# Patient Record
Sex: Male | Born: 1965 | Race: White | Hispanic: No | Marital: Married | Smoking: Current every day smoker
Health system: Southern US, Community
[De-identification: ages and names within clinical notes are randomized; demographics above are authoritative.]

## PROBLEM LIST (undated history)

## (undated) DIAGNOSIS — M549 Dorsalgia, unspecified: Secondary | ICD-10-CM

## (undated) DIAGNOSIS — S66929A Laceration of unspecified muscle, fascia and tendon at wrist and hand level, unspecified hand, initial encounter: Secondary | ICD-10-CM

## (undated) DIAGNOSIS — S7290XA Unspecified fracture of unspecified femur, initial encounter for closed fracture: Secondary | ICD-10-CM

## (undated) DIAGNOSIS — I10 Essential (primary) hypertension: Secondary | ICD-10-CM

## (undated) DIAGNOSIS — S61419A Laceration without foreign body of unspecified hand, initial encounter: Secondary | ICD-10-CM

## (undated) DIAGNOSIS — Z8739 Personal history of other diseases of the musculoskeletal system and connective tissue: Secondary | ICD-10-CM

## (undated) DIAGNOSIS — S92902A Unspecified fracture of left foot, initial encounter for closed fracture: Secondary | ICD-10-CM

## (undated) HISTORY — DX: Dorsalgia, unspecified: M54.9

## (undated) HISTORY — DX: Unspecified fracture of left foot, initial encounter for closed fracture: S92.902A

## (undated) HISTORY — DX: Laceration without foreign body of unspecified hand, initial encounter: S61.419A

## (undated) HISTORY — DX: Essential (primary) hypertension: I10

## (undated) HISTORY — DX: Unspecified fracture of unspecified femur, initial encounter for closed fracture: S72.90XA

## (undated) HISTORY — DX: Personal history of other diseases of the musculoskeletal system and connective tissue: Z87.39

## (undated) HISTORY — DX: Laceration of unspecified muscle, fascia and tendon at wrist and hand level, unspecified hand, initial encounter: S66.929A

---

## 1984-07-06 DIAGNOSIS — S7290XA Unspecified fracture of unspecified femur, initial encounter for closed fracture: Secondary | ICD-10-CM

## 1984-07-06 DIAGNOSIS — S92902A Unspecified fracture of left foot, initial encounter for closed fracture: Secondary | ICD-10-CM

## 1984-07-06 DIAGNOSIS — S61419A Laceration without foreign body of unspecified hand, initial encounter: Secondary | ICD-10-CM

## 1984-07-06 HISTORY — DX: Laceration without foreign body of unspecified hand, initial encounter: S61.419A

## 1984-07-06 HISTORY — DX: Unspecified fracture of unspecified femur, initial encounter for closed fracture: S72.90XA

## 1984-07-06 HISTORY — DX: Unspecified fracture of left foot, initial encounter for closed fracture: S92.902A

## 2015-05-14 DIAGNOSIS — I10 Essential (primary) hypertension: Secondary | ICD-10-CM | POA: Insufficient documentation

## 2015-05-14 DIAGNOSIS — Z87898 Personal history of other specified conditions: Secondary | ICD-10-CM | POA: Insufficient documentation

## 2015-05-14 DIAGNOSIS — M549 Dorsalgia, unspecified: Secondary | ICD-10-CM | POA: Insufficient documentation

## 2015-05-14 DIAGNOSIS — M542 Cervicalgia: Secondary | ICD-10-CM | POA: Insufficient documentation

## 2015-06-04 DIAGNOSIS — D45 Polycythemia vera: Secondary | ICD-10-CM | POA: Insufficient documentation

## 2015-06-04 DIAGNOSIS — E781 Pure hyperglyceridemia: Secondary | ICD-10-CM | POA: Insufficient documentation

## 2015-06-14 ENCOUNTER — Encounter: Payer: Self-pay | Admitting: Oncology

## 2015-06-14 ENCOUNTER — Inpatient Hospital Stay: Payer: BLUE CROSS/BLUE SHIELD | Attending: Oncology | Admitting: Oncology

## 2015-06-14 ENCOUNTER — Inpatient Hospital Stay: Payer: BLUE CROSS/BLUE SHIELD

## 2015-06-14 VITALS — BP 149/104 | HR 72 | Temp 97.5°F | Resp 18 | Wt 183.4 lb

## 2015-06-14 DIAGNOSIS — I10 Essential (primary) hypertension: Secondary | ICD-10-CM | POA: Diagnosis not present

## 2015-06-14 DIAGNOSIS — D751 Secondary polycythemia: Secondary | ICD-10-CM

## 2015-06-14 DIAGNOSIS — F17210 Nicotine dependence, cigarettes, uncomplicated: Secondary | ICD-10-CM

## 2015-06-14 DIAGNOSIS — D45 Polycythemia vera: Secondary | ICD-10-CM

## 2015-06-14 DIAGNOSIS — Z79899 Other long term (current) drug therapy: Secondary | ICD-10-CM

## 2015-06-14 LAB — IRON AND TIBC
IRON: 74 ug/dL (ref 45–182)
SATURATION RATIOS: 20 % (ref 17.9–39.5)
TIBC: 367 ug/dL (ref 250–450)
UIBC: 293 ug/dL

## 2015-06-14 LAB — CBC
HEMATOCRIT: 55 % — AB (ref 40.0–52.0)
HEMOGLOBIN: 19.2 g/dL — AB (ref 13.0–18.0)
MCH: 33.1 pg (ref 26.0–34.0)
MCHC: 34.9 g/dL (ref 32.0–36.0)
MCV: 94.9 fL (ref 80.0–100.0)
Platelets: 265 10*3/uL (ref 150–440)
RBC: 5.8 MIL/uL (ref 4.40–5.90)
RDW: 12.8 % (ref 11.5–14.5)
WBC: 11.9 10*3/uL — ABNORMAL HIGH (ref 3.8–10.6)

## 2015-06-14 LAB — FERRITIN: Ferritin: 132 ng/mL (ref 24–336)

## 2015-06-14 NOTE — Progress Notes (Signed)
Patient offers no problems today.  Blood pressure is elevated

## 2015-06-19 LAB — JAK2 GENOTYPR

## 2015-06-19 LAB — CARBON MONOXIDE, BLOOD (PERFORMED AT REF LAB): CARBON MONOXIDE, BLOOD: 6.9 % — AB (ref 0.0–1.9)

## 2015-06-19 LAB — ERYTHROPOIETIN: Erythropoietin: 9.3 m[IU]/mL (ref 2.6–18.5)

## 2015-06-20 LAB — HEMOCHROMATOSIS DNA-PCR(C282Y,H63D)

## 2015-06-21 ENCOUNTER — Inpatient Hospital Stay: Payer: BLUE CROSS/BLUE SHIELD

## 2015-06-21 ENCOUNTER — Inpatient Hospital Stay (HOSPITAL_BASED_OUTPATIENT_CLINIC_OR_DEPARTMENT_OTHER): Payer: BLUE CROSS/BLUE SHIELD | Admitting: Oncology

## 2015-06-21 VITALS — BP 156/95 | HR 89 | Temp 98.1°F | Resp 16

## 2015-06-21 DIAGNOSIS — Z79899 Other long term (current) drug therapy: Secondary | ICD-10-CM | POA: Diagnosis not present

## 2015-06-21 DIAGNOSIS — F17210 Nicotine dependence, cigarettes, uncomplicated: Secondary | ICD-10-CM

## 2015-06-21 DIAGNOSIS — D751 Secondary polycythemia: Secondary | ICD-10-CM | POA: Diagnosis not present

## 2015-06-21 DIAGNOSIS — I10 Essential (primary) hypertension: Secondary | ICD-10-CM

## 2015-06-21 NOTE — Progress Notes (Signed)
[facility]  [phone] [phone]) [phone]  ID: Joel Levine OB: 08/07/1965  MR#: [id number]  [id number]  No care team member to display  CHIEF COMPLAINT:  Chief Complaint  Patient presents with  . New Evaluation    polycythemia    INTERVAL HISTORY: Patient is a 49 year old male who was found to have a significantly elevated hemoglobin on routine blood work. Repeat testing confirmed the results. He currently feels well and is asymptomatic. He denies any recent fevers or illnesses. He has no new medications. He has a good appetite and denies weight loss. He has no neurologic complaints. He denies any chest pain or shortness of breath. He denies any nausea, vomiting, constipation, or diarrhea. He has no urinary complaints. Patient offers no specific complaints today.  REVIEW OF SYSTEMS:   Review of Systems  Constitutional: Negative.  Negative for fever, weight loss and malaise/fatigue.  Respiratory: Negative.   Cardiovascular: Negative.   Gastrointestinal: Negative.   Musculoskeletal: Negative.   Neurological: Negative.  Negative for weakness.    As per HPI. Otherwise, a complete review of systems is negatve.  PAST MEDICAL HISTORY: Past Medical History  Diagnosis Date  . History of low back pain     Uses leverage and gravity from injury 30 years ago  . Upper back pain   . Essential hypertension   . Fracture, femur ([place]) 1986    shattered, left  . Hand laceration involving tendon 1986    left  . Foot fracture, left 1986    PAST SURGICAL HISTORY: No past surgical history on file.  FAMILY HISTORY Family History  Problem Relation Age of Onset  . Alzheimer's disease Mother   . Cancer Father     unknown origin       ADVANCED DIRECTIVES:    HEALTH MAINTENANCE: Social History  Substance Use Topics  . Smoking status: Current Every Day Smoker -- 1.00 packs/day    Types: Cigarettes  . Smokeless tobacco: Never Used  . Alcohol Use:  0.0 oz/week    0 Standard drinks or equivalent per week     Comment: 15 cans of beer     Colonoscopy:  PAP:  Bone density:  Lipid panel:  No Known Allergies  Current Outpatient Prescriptions  Medication Sig Dispense Refill  . hydrochlorothiazide (HYDRODIURIL) 25 MG tablet TAKE 1 TABLET (25 MG TOTAL) BY MOUTH ONCE DAILY.  11   No current facility-administered medications for this visit.    OBJECTIVE: Filed Vitals:   06/14/15 1144  BP: 149/104  Pulse: 72  Temp: 97.5 F (36.4 C)  Resp: 18     There is no height on file to calculate BMI.    ECOG FS:0 - Asymptomatic  General: Well-developed, well-nourished, no acute distress. Eyes: Pink conjunctiva, anicteric sclera. HEENT: Normocephalic, moist mucous membranes, clear oropharnyx. Lungs: Clear to auscultation bilaterally. Heart: Regular rate and rhythm. No rubs, murmurs, or gallops. Abdomen: Soft, nontender, nondistended. No organomegaly noted, normoactive bowel sounds. Musculoskeletal: No edema, cyanosis, or clubbing. Neuro: Alert, answering all questions appropriately. Cranial nerves grossly intact. Skin: No rashes or petechiae noted. Psych: Normal affect. Lymphatics: No cervical, calvicular, axillary or inguinal LAD.   LAB RESULTS:  No results found for: NA, K, CL, CO2, GLUCOSE, BUN, CREATININE, CALCIUM, PROT, ALBUMIN, AST, ALT, ALKPHOS, BILITOT, GFRNONAA, GFRAA  Lab Results  Component Value Date   WBC 11.9* 06/14/2015   HGB 19.2* 06/14/2015   HCT 55.0* 06/14/2015   MCV 94.9 06/14/2015   PLT 265 06/14/2015  STUDIES: No results found.  ASSESSMENT: Secondary polycythemia  PLAN:    1. Polycythemia: Patient's hemoglobin is significantly elevated. He was found to have a carboxyhemoglobin of approximately 7% which is likely contributing to his polycythemia. The remainder of his blood work including JAK-2 mutation and iron stores are within normal limits. Patient will require phlebotomy has agreed to return to  clinic in 1 week to discuss his laboratory results and initiation of treatments. 2. Hypertension: Blood pressure mildly elevated today, continue current medications as prescribed.  Patient expressed understanding and was in agreement with this plan. He also understands that He can call clinic at any time with any questions, concerns, or complaints.    Joel Huger, MD   06/21/2015 12:16 PM

## 2015-06-21 NOTE — Progress Notes (Signed)
[facility]  Telephone:(336) [phone] [phone]) [phone]  ID: Joel Levine OB: October 17, 1965  MR#: [id number]  [id number]  No care team member to display  CHIEF COMPLAINT:  Chief Complaint  Patient presents with  . polycythemia    INTERVAL HISTORY: Patient returns to clinic today for further evaluation, discussion of his laboratory work, and initiation of phlebotomy. He is mildly anxious, but otherwise feels well.  He denies any recent fevers or illnesses. He has no new medications. He has a good appetite and denies weight loss. He has no neurologic complaints. He denies any chest pain or shortness of breath. He denies any nausea, vomiting, constipation, or diarrhea. He has no urinary complaints. Patient offers no specific complaints today.  REVIEW OF SYSTEMS:   Review of Systems  Constitutional: Negative.  Negative for fever, weight loss and malaise/fatigue.  Respiratory: Negative.   Cardiovascular: Negative.   Gastrointestinal: Negative.   Musculoskeletal: Negative.   Neurological: Negative.  Negative for weakness.  Psychiatric/Behavioral: The patient is nervous/anxious.     As per HPI. Otherwise, a complete review of systems is negatve.  PAST MEDICAL HISTORY: Past Medical History  Diagnosis Date  . History of low back pain     Uses leverage and gravity from injury 30 years ago  . Upper back pain   . Essential hypertension   . Fracture, femur ([place]) 1986    shattered, left  . Hand laceration involving tendon 1986    left  . Foot fracture, left 1986    PAST SURGICAL HISTORY: No past surgical history on file.  FAMILY HISTORY Family History  Problem Relation Age of Onset  . Alzheimer's disease Mother   . Cancer Father     unknown origin       ADVANCED DIRECTIVES:    HEALTH MAINTENANCE: Social History  Substance Use Topics  . Smoking status: Current Every Day Smoker -- 1.00 packs/day    Types: Cigarettes  . Smokeless tobacco: Never Used    . Alcohol Use: 0.0 oz/week    0 Standard drinks or equivalent per week     Comment: 15 cans of beer     Colonoscopy:  PAP:  Bone density:  Lipid panel:  No Known Allergies  Current Outpatient Prescriptions  Medication Sig Dispense Refill  . hydrochlorothiazide (HYDRODIURIL) 25 MG tablet TAKE 1 TABLET (25 MG TOTAL) BY MOUTH ONCE DAILY.  11   No current facility-administered medications for this visit.    OBJECTIVE: Filed Vitals:   06/21/15 1009  BP: 156/95  Pulse: 89  Temp: 98.1 F (36.7 C)  Resp: 16     There is no height or weight on file to calculate BMI.    ECOG FS:0 - Asymptomatic  General: Well-developed, well-nourished, no acute distress. Eyes: Pink conjunctiva, anicteric sclera. Lungs: Clear to auscultation bilaterally. Heart: Regular rate and rhythm. No rubs, murmurs, or gallops. Abdomen: Soft, nontender, nondistended. No organomegaly noted, normoactive bowel sounds. Musculoskeletal: No edema, cyanosis, or clubbing. Neuro: Alert, answering all questions appropriately. Cranial nerves grossly intact. Skin: No rashes or petechiae noted. Psych: Normal affect.   LAB RESULTS:  No results found for: NA, K, CL, CO2, GLUCOSE, BUN, CREATININE, CALCIUM, PROT, ALBUMIN, AST, ALT, ALKPHOS, BILITOT, GFRNONAA, GFRAA  Lab Results  Component Value Date   WBC 11.9* 06/14/2015   HGB 19.2* 06/14/2015   HCT 55.0* 06/14/2015   MCV 94.9 06/14/2015   PLT 265 06/14/2015     STUDIES: No results found.  ASSESSMENT: Secondary polycythemia  PLAN:  1. Polycythemia: Patient's hemoglobin is significantly elevated 19.2. He was found to have a carboxyhemoglobin of approximately 7% which is likely contributing to his polycythemia. The remainder of his blood work including JAK-2 mutation and iron stores are within normal limits. Patient has to leave for work, therefore he will return next week to receive 400 mL phlebotomy. He will then return to clinic in 3 months with repeat  laboratory work and further evaluation. 2. Hypertension: Blood pressure mildly elevated today, continue current medications as prescribed. 3. Smoking cessation: Patient expressed understanding that the reason for his polycythemia is likely secondary to his ongoing tobacco use. He does not expressed a desire to quit at this time.  Patient expressed understanding and was in agreement with this plan. He also understands that He can call clinic at any time with any questions, concerns, or complaints.    [name], MD   06/21/2015 12:19 PM

## 2015-07-04 ENCOUNTER — Inpatient Hospital Stay: Payer: BLUE CROSS/BLUE SHIELD

## 2015-07-04 VITALS — BP 152/97 | HR 85 | Temp 98.1°F

## 2015-07-04 DIAGNOSIS — D751 Secondary polycythemia: Secondary | ICD-10-CM

## 2015-07-04 NOTE — Progress Notes (Signed)
400 units of blood removed from patient.  Patient denies pain, discomfort or dizziness, tolerated well.

## 2015-07-04 NOTE — Progress Notes (Signed)
Per Santiago Glad, Rn spoke w/ Dr. Grayland Ormond.  No new orders for labs at this time.

## 2015-08-23 ENCOUNTER — Other Ambulatory Visit: Payer: BLUE CROSS/BLUE SHIELD

## 2015-08-23 ENCOUNTER — Ambulatory Visit: Payer: BLUE CROSS/BLUE SHIELD | Admitting: Oncology

## 2015-09-13 ENCOUNTER — Other Ambulatory Visit: Payer: Self-pay | Admitting: *Deleted

## 2015-09-13 DIAGNOSIS — D45 Polycythemia vera: Secondary | ICD-10-CM

## 2015-09-19 ENCOUNTER — Other Ambulatory Visit: Payer: Self-pay | Admitting: *Deleted

## 2015-09-19 DIAGNOSIS — D45 Polycythemia vera: Secondary | ICD-10-CM

## 2015-09-20 ENCOUNTER — Other Ambulatory Visit: Payer: BLUE CROSS/BLUE SHIELD

## 2015-09-20 ENCOUNTER — Ambulatory Visit: Payer: BLUE CROSS/BLUE SHIELD | Admitting: Oncology

## 2015-10-04 ENCOUNTER — Inpatient Hospital Stay: Payer: BLUE CROSS/BLUE SHIELD

## 2015-10-04 ENCOUNTER — Inpatient Hospital Stay: Payer: BLUE CROSS/BLUE SHIELD | Admitting: Oncology

## 2015-10-11 ENCOUNTER — Inpatient Hospital Stay (HOSPITAL_BASED_OUTPATIENT_CLINIC_OR_DEPARTMENT_OTHER): Payer: BLUE CROSS/BLUE SHIELD | Admitting: Oncology

## 2015-10-11 ENCOUNTER — Inpatient Hospital Stay: Payer: BLUE CROSS/BLUE SHIELD | Attending: Oncology

## 2015-10-11 ENCOUNTER — Inpatient Hospital Stay: Payer: BLUE CROSS/BLUE SHIELD

## 2015-10-11 VITALS — BP 135/89 | HR 69 | Temp 98.6°F | Resp 16 | Wt 182.1 lb

## 2015-10-11 DIAGNOSIS — D751 Secondary polycythemia: Secondary | ICD-10-CM

## 2015-10-11 DIAGNOSIS — F17210 Nicotine dependence, cigarettes, uncomplicated: Secondary | ICD-10-CM | POA: Insufficient documentation

## 2015-10-11 DIAGNOSIS — I10 Essential (primary) hypertension: Secondary | ICD-10-CM | POA: Insufficient documentation

## 2015-10-11 DIAGNOSIS — Z79899 Other long term (current) drug therapy: Secondary | ICD-10-CM | POA: Diagnosis not present

## 2015-10-11 DIAGNOSIS — Z7982 Long term (current) use of aspirin: Secondary | ICD-10-CM | POA: Insufficient documentation

## 2015-10-11 DIAGNOSIS — D45 Polycythemia vera: Secondary | ICD-10-CM

## 2015-10-11 LAB — CBC WITH DIFFERENTIAL/PLATELET
BASOS ABS: 0.1 10*3/uL (ref 0–0.1)
BASOS PCT: 1 %
EOS ABS: 0.7 10*3/uL (ref 0–0.7)
Eosinophils Relative: 8 %
HCT: 45.9 % (ref 40.0–52.0)
HEMOGLOBIN: 15.9 g/dL (ref 13.0–18.0)
LYMPHS ABS: 2.3 10*3/uL (ref 1.0–3.6)
Lymphocytes Relative: 24 %
MCH: 32.7 pg (ref 26.0–34.0)
MCHC: 34.6 g/dL (ref 32.0–36.0)
MCV: 94.5 fL (ref 80.0–100.0)
Monocytes Absolute: 0.8 10*3/uL (ref 0.2–1.0)
Monocytes Relative: 8 %
NEUTROS PCT: 59 %
Neutro Abs: 5.6 10*3/uL (ref 1.4–6.5)
PLATELETS: 376 10*3/uL (ref 150–440)
RBC: 4.86 MIL/uL (ref 4.40–5.90)
RDW: 12.7 % (ref 11.5–14.5)
WBC: 9.5 10*3/uL (ref 3.8–10.6)

## 2015-10-11 NOTE — Progress Notes (Signed)
Patient does not offer any problems today.  

## 2015-10-20 NOTE — Progress Notes (Signed)
[facility]  Telephone:(336) [phone] [phone]) [phone]  ID: Fredia Sorrow OB: 1966-03-22  MR#: [id number]  [id number]  No care team member to display  CHIEF COMPLAINT:  Chief Complaint  Patient presents with  . polycythemia    INTERVAL HISTORY: Patient returns to clinic today for further evaluation and consideration of additional phlebotomy. He currently feels well and is asymptomatic.  He denies any recent fevers or illnesses. He has no new medications. He has a good appetite and denies weight loss. He has no neurologic complaints. He denies any chest pain or shortness of breath. He denies any nausea, vomiting, constipation, or diarrhea. He has no urinary complaints. Patient offers no specific complaints today.  REVIEW OF SYSTEMS:   Review of Systems  Constitutional: Negative.  Negative for fever, weight loss and malaise/fatigue.  Respiratory: Negative.  Negative for shortness of breath.   Cardiovascular: Negative.  Negative for chest pain.  Gastrointestinal: Negative.   Genitourinary: Negative.   Musculoskeletal: Negative.   Neurological: Negative.  Negative for weakness.  Psychiatric/Behavioral: The patient is not nervous/anxious.     As per HPI. Otherwise, a complete review of systems is negatve.  PAST MEDICAL HISTORY: Past Medical History  Diagnosis Date  . History of low back pain     Uses leverage and gravity from injury 30 years ago  . Upper back pain   . Essential hypertension   . Fracture, femur ([place]) 1986    shattered, left  . Hand laceration involving tendon 1986    left  . Foot fracture, left 1986    PAST SURGICAL HISTORY: No past surgical history on file.  FAMILY HISTORY Family History  Problem Relation Age of Onset  . Alzheimer's disease Mother   . Cancer Father     unknown origin       ADVANCED DIRECTIVES:    HEALTH MAINTENANCE: Social History  Substance Use Topics  . Smoking status: Current Every Day Smoker -- 1.00  packs/day    Types: Cigarettes  . Smokeless tobacco: Never Used  . Alcohol Use: 0.0 oz/week    0 Standard drinks or equivalent per week     Comment: 15 cans of beer     Colonoscopy:  PAP:  Bone density:  Lipid panel:  No Known Allergies  Current Outpatient Prescriptions  Medication Sig Dispense Refill  . aspirin 81 MG tablet Take 81 mg by mouth daily.    . hydrochlorothiazide (HYDRODIURIL) 25 MG tablet TAKE 1 TABLET (25 MG TOTAL) BY MOUTH ONCE DAILY.  11  . HYDROcodone-acetaminophen (NORCO/VICODIN) 5-325 MG tablet     . lisinopril (PRINIVIL,ZESTRIL) 10 MG tablet Take by mouth.    . naproxen (NAPROSYN) 500 MG tablet Take by mouth.     No current facility-administered medications for this visit.    OBJECTIVE: Filed Vitals:   10/11/15 1100  BP: 135/89  Pulse: 69  Temp: 98.6 F (37 C)  Resp: 16     There is no height on file to calculate BMI.    ECOG FS:0 - Asymptomatic  General: Well-developed, well-nourished, no acute distress. Eyes: Pink conjunctiva, anicteric sclera. Lungs: Clear to auscultation bilaterally. Heart: Regular rate and rhythm. No rubs, murmurs, or gallops. Abdomen: Soft, nontender, nondistended. No organomegaly noted, normoactive bowel sounds. Musculoskeletal: No edema, cyanosis, or clubbing. Neuro: Alert, answering all questions appropriately. Cranial nerves grossly intact. Skin: No rashes or petechiae noted. Psych: Normal affect.   LAB RESULTS:  No results found for: NA, K, CL, CO2, GLUCOSE, BUN, CREATININE, CALCIUM,  PROT, ALBUMIN, AST, ALT, ALKPHOS, BILITOT, GFRNONAA, GFRAA  Lab Results  Component Value Date   WBC 9.5 10/11/2015   NEUTROABS 5.6 10/11/2015   HGB 15.9 10/11/2015   HCT 45.9 10/11/2015   MCV 94.5 10/11/2015   PLT 376 10/11/2015   Lab Results  Component Value Date   FERRITIN 132 06/14/2015     STUDIES: No results found.  ASSESSMENT: Secondary polycythemia  PLAN:    1. Polycythemia: Patient's hemoglobin has improved  significantly and is now 15.9. He does not require additional 400 mL phlebotomy today. Previously, he was found to have a carboxyhemoglobin of approximately 7% which is likely contributing to his polycythemia. The remainder of his blood work including JAK-2 mutation and iron stores are within normal limits. Return to clinic in 4 months with repeat laboratory work and further evaluation. 2. Hypertension: Blood pressure mildly elevated today, continue current medications as prescribed. 3. Smoking cessation: Patient expressed understanding that the reason for his polycythemia is likely secondary to his ongoing tobacco use. He does not expressed a desire to quit at this time.  Patient expressed understanding and was in agreement with this plan. He also understands that He can call clinic at any time with any questions, concerns, or complaints.    [name], MD   10/20/2015 11:46 PM

## 2016-02-21 ENCOUNTER — Ambulatory Visit: Payer: BLUE CROSS/BLUE SHIELD | Admitting: Oncology

## 2016-02-21 ENCOUNTER — Other Ambulatory Visit: Payer: BLUE CROSS/BLUE SHIELD

## 2017-02-03 ENCOUNTER — Other Ambulatory Visit: Payer: Self-pay | Admitting: Pediatrics

## 2017-02-03 DIAGNOSIS — I10 Essential (primary) hypertension: Secondary | ICD-10-CM

## 2017-02-03 DIAGNOSIS — R160 Hepatomegaly, not elsewhere classified: Secondary | ICD-10-CM

## 2021-06-17 ENCOUNTER — Other Ambulatory Visit: Payer: Self-pay | Admitting: Family Medicine

## 2021-06-17 DIAGNOSIS — M25512 Pain in left shoulder: Secondary | ICD-10-CM

## 2021-06-19 ENCOUNTER — Ambulatory Visit
Admission: RE | Admit: 2021-06-19 | Discharge: 2021-06-19 | Disposition: A | Discharge status: Home / Self Care | Payer: BLUE CROSS/BLUE SHIELD | Source: Ambulatory Visit | Attending: Family Medicine | Admitting: Family Medicine

## 2021-06-19 ENCOUNTER — Ambulatory Visit
Admission: RE | Admit: 2021-06-19 | Discharge: 2021-06-19 | Disposition: A | Discharge status: Home / Self Care | Payer: BLUE CROSS/BLUE SHIELD | Attending: Family Medicine | Admitting: Family Medicine

## 2021-06-19 DIAGNOSIS — M25512 Pain in left shoulder: Secondary | ICD-10-CM | POA: Insufficient documentation

## 2022-12-18 IMAGING — CR DG SHOULDER 2+V*L*
3 series · 3 of 3 positions shown · non-contrast
Comparison: None.

CLINICAL DATA: Left shoulder pain.

EXAM:
LEFT SHOULDER - 2+ VIEW

[shoulder grashey]
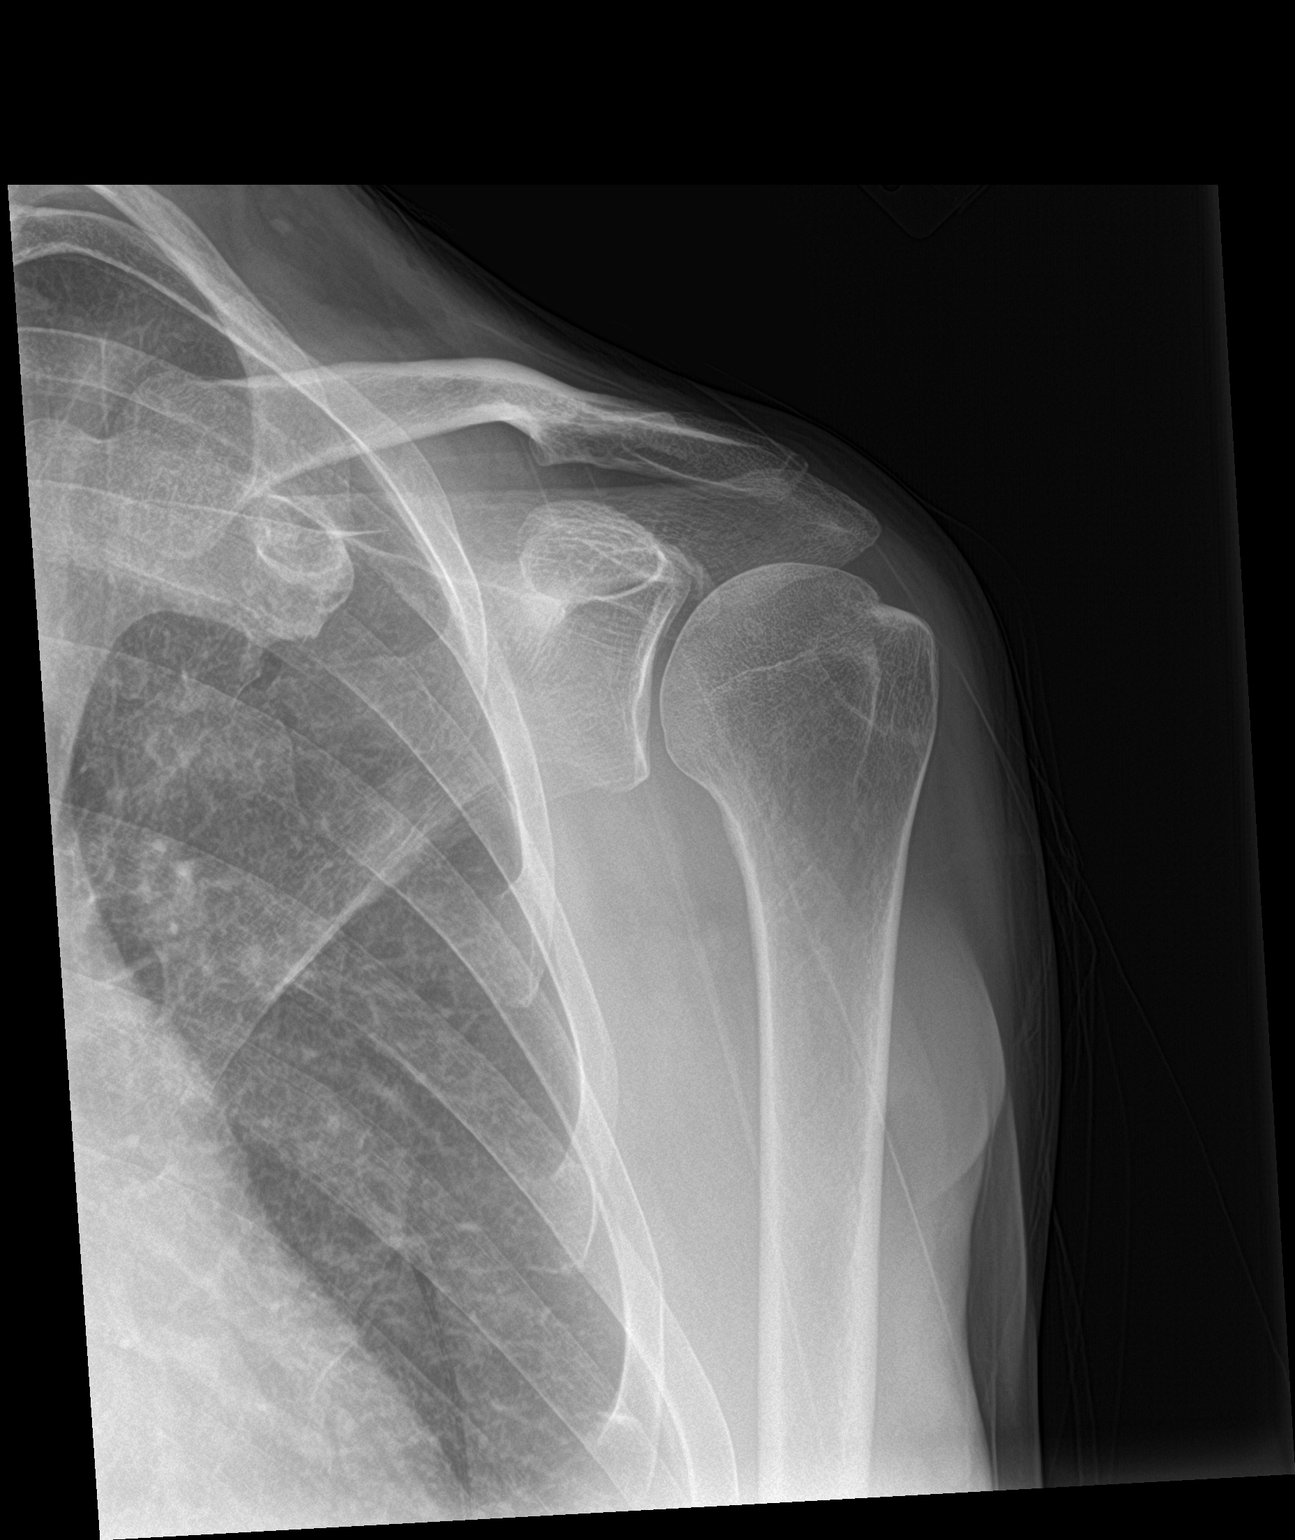

[shoulder y view]
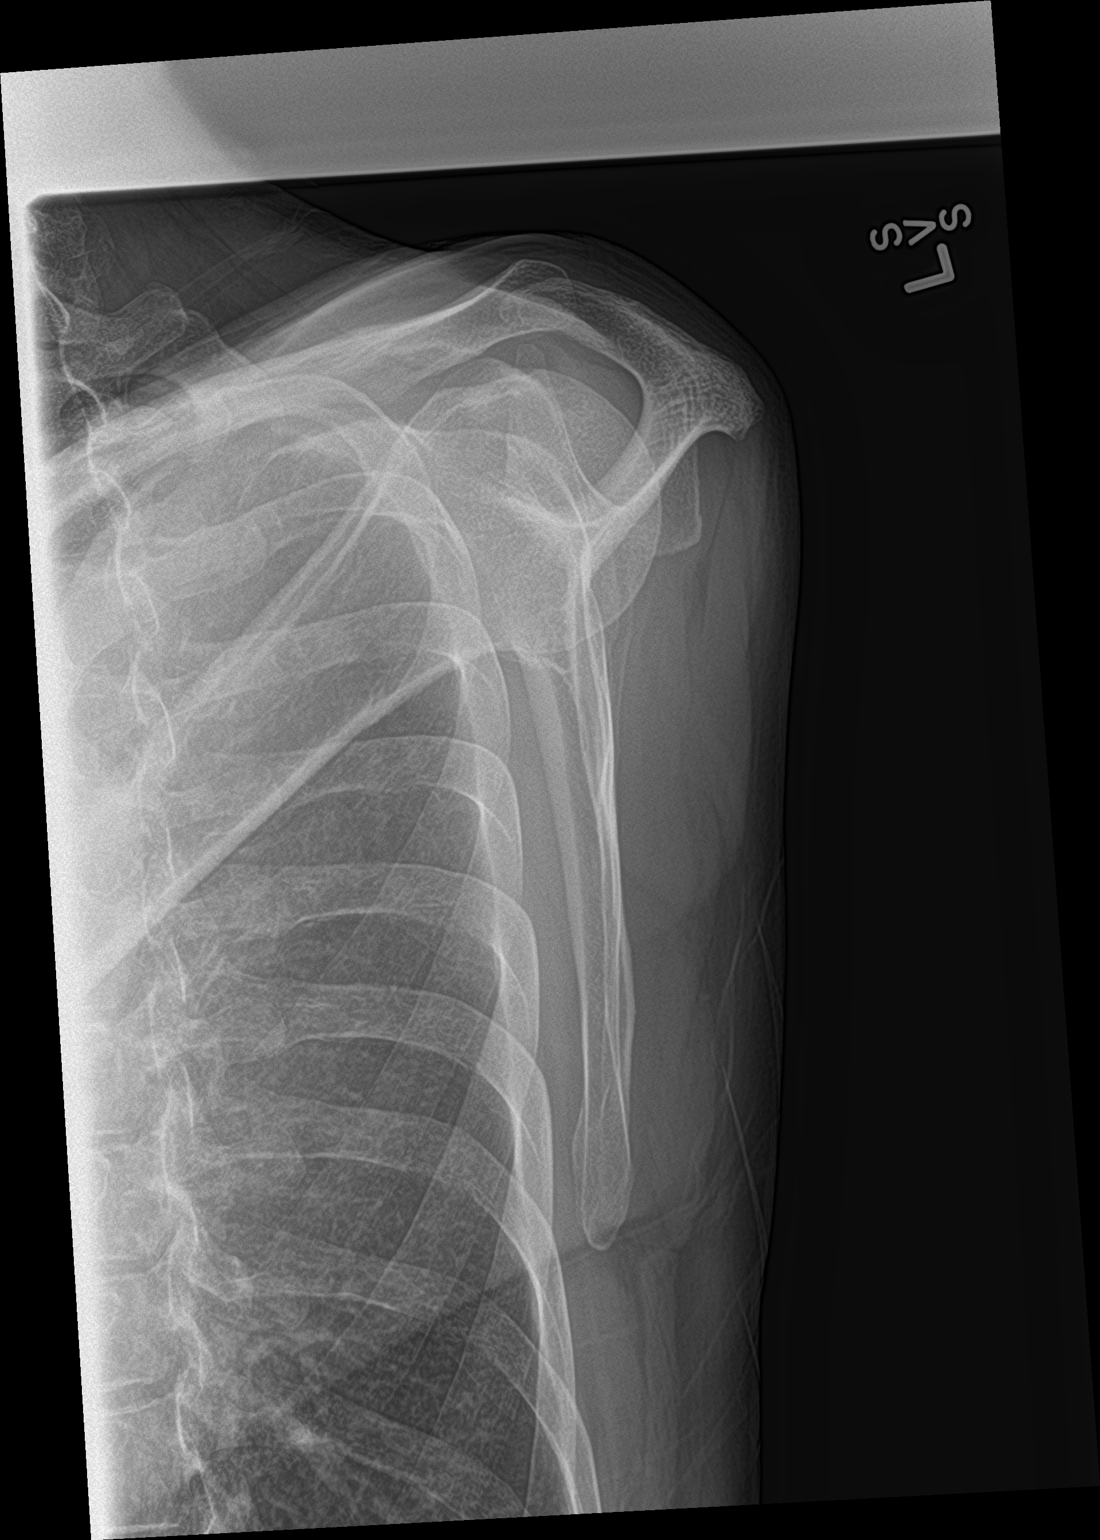

[shoulder axillary]
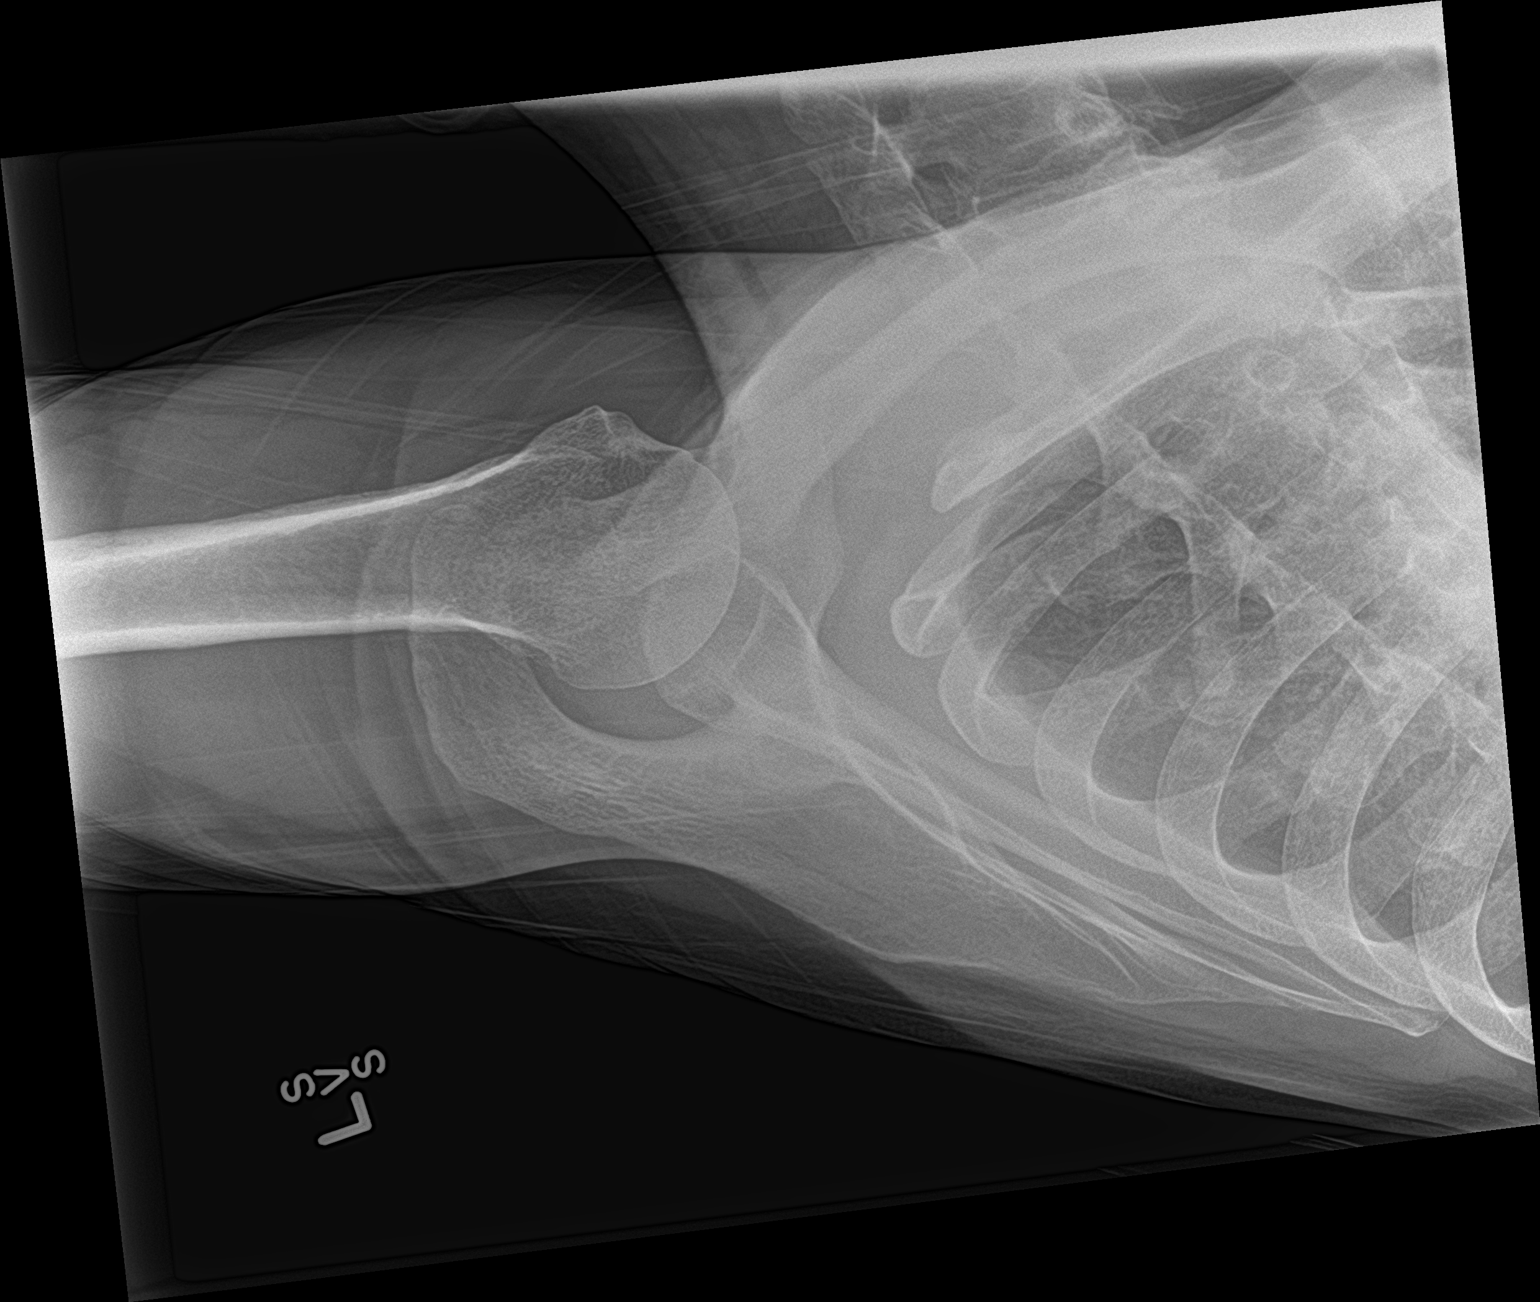

[3 of 3 positions shown; findings below may reference images not displayed]

FINDINGS: There is no evidence of fracture or dislocation. There is no
evidence of arthropathy or other focal bone abnormality. Soft
tissues are unremarkable.
IMPRESSION: Negative.
